# Patient Record
Sex: Female | Born: 1935 | Race: White | Hispanic: No | Marital: Married | Smoking: Never smoker
Health system: Southern US, Community
[De-identification: ages and names within clinical notes are randomized; demographics above are authoritative.]

## PROBLEM LIST (undated history)

## (undated) DIAGNOSIS — R413 Other amnesia: Secondary | ICD-10-CM

## (undated) DIAGNOSIS — E7800 Pure hypercholesterolemia, unspecified: Secondary | ICD-10-CM

## (undated) DIAGNOSIS — H3530 Unspecified macular degeneration: Secondary | ICD-10-CM

## (undated) DIAGNOSIS — Z853 Personal history of malignant neoplasm of breast: Secondary | ICD-10-CM

## (undated) DIAGNOSIS — L409 Psoriasis, unspecified: Secondary | ICD-10-CM

## (undated) DIAGNOSIS — M1990 Unspecified osteoarthritis, unspecified site: Secondary | ICD-10-CM

## (undated) DIAGNOSIS — C801 Malignant (primary) neoplasm, unspecified: Secondary | ICD-10-CM

## (undated) DIAGNOSIS — E349 Endocrine disorder, unspecified: Secondary | ICD-10-CM

## (undated) DIAGNOSIS — C4490 Unspecified malignant neoplasm of skin, unspecified: Secondary | ICD-10-CM

## (undated) DIAGNOSIS — N3090 Cystitis, unspecified without hematuria: Secondary | ICD-10-CM

## (undated) HISTORY — DX: Unspecified malignant neoplasm of skin, unspecified: C44.90

## (undated) HISTORY — DX: Psoriasis, unspecified: L40.9

## (undated) HISTORY — DX: Malignant (primary) neoplasm, unspecified: C80.1

## (undated) HISTORY — DX: Other amnesia: R41.3

## (undated) HISTORY — DX: Personal history of malignant neoplasm of breast: Z85.3

## (undated) HISTORY — DX: Cystitis, unspecified without hematuria: N30.90

## (undated) HISTORY — DX: Endocrine disorder, unspecified: E34.9

## (undated) HISTORY — DX: Pure hypercholesterolemia, unspecified: E78.00

## (undated) HISTORY — DX: Unspecified osteoarthritis, unspecified site: M19.90

## (undated) HISTORY — DX: Unspecified macular degeneration: H35.30

---
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
## 1999-02-28 DIAGNOSIS — C801 Malignant (primary) neoplasm, unspecified: Secondary | ICD-10-CM

## 1999-02-28 HISTORY — PX: BREAST LUMPECTOMY: SHX2

## 1999-02-28 HISTORY — DX: Malignant (primary) neoplasm, unspecified: C80.1

## 2000-02-28 DIAGNOSIS — Z853 Personal history of malignant neoplasm of breast: Secondary | ICD-10-CM

## 2000-02-28 HISTORY — DX: Personal history of malignant neoplasm of breast: Z85.3

## 2004-03-28 ENCOUNTER — Ambulatory Visit: Payer: Self-pay | Admitting: Family Medicine

## 2004-04-05 ENCOUNTER — Ambulatory Visit: Payer: Self-pay | Admitting: Internal Medicine

## 2004-04-05 ENCOUNTER — Ambulatory Visit: Payer: Self-pay | Admitting: Family Medicine

## 2004-04-27 ENCOUNTER — Ambulatory Visit: Payer: Self-pay | Admitting: Internal Medicine

## 2004-08-31 ENCOUNTER — Ambulatory Visit: Payer: Self-pay | Admitting: Chiropractic Medicine

## 2004-08-31 ENCOUNTER — Ambulatory Visit: Payer: Self-pay | Admitting: General Surgery

## 2004-10-12 ENCOUNTER — Ambulatory Visit: Payer: Self-pay | Admitting: Internal Medicine

## 2004-10-28 ENCOUNTER — Ambulatory Visit: Payer: Self-pay | Admitting: Internal Medicine

## 2005-04-03 ENCOUNTER — Ambulatory Visit: Payer: Self-pay | Admitting: General Surgery

## 2005-09-07 ENCOUNTER — Ambulatory Visit: Payer: Self-pay | Admitting: Gastroenterology

## 2005-10-19 ENCOUNTER — Ambulatory Visit: Payer: Self-pay | Admitting: Internal Medicine

## 2005-10-28 ENCOUNTER — Ambulatory Visit: Payer: Self-pay | Admitting: Internal Medicine

## 2006-04-06 ENCOUNTER — Ambulatory Visit: Payer: Self-pay | Admitting: General Surgery

## 2006-09-28 ENCOUNTER — Ambulatory Visit: Payer: Self-pay | Admitting: Internal Medicine

## 2006-10-19 ENCOUNTER — Ambulatory Visit: Payer: Self-pay | Admitting: Internal Medicine

## 2006-10-29 ENCOUNTER — Ambulatory Visit: Payer: Self-pay | Admitting: Internal Medicine

## 2007-02-19 IMAGING — CR DG LUMBAR SPINE 2-3V
1 series · 3 of 3 positions shown · non-contrast
Comparison: none

REASON FOR EXAM: neck and back pain
COMMENTS:

[Series 1: view not recorded · 0.17mm/px · 3 of 3 slices shown]
[im 1/3]
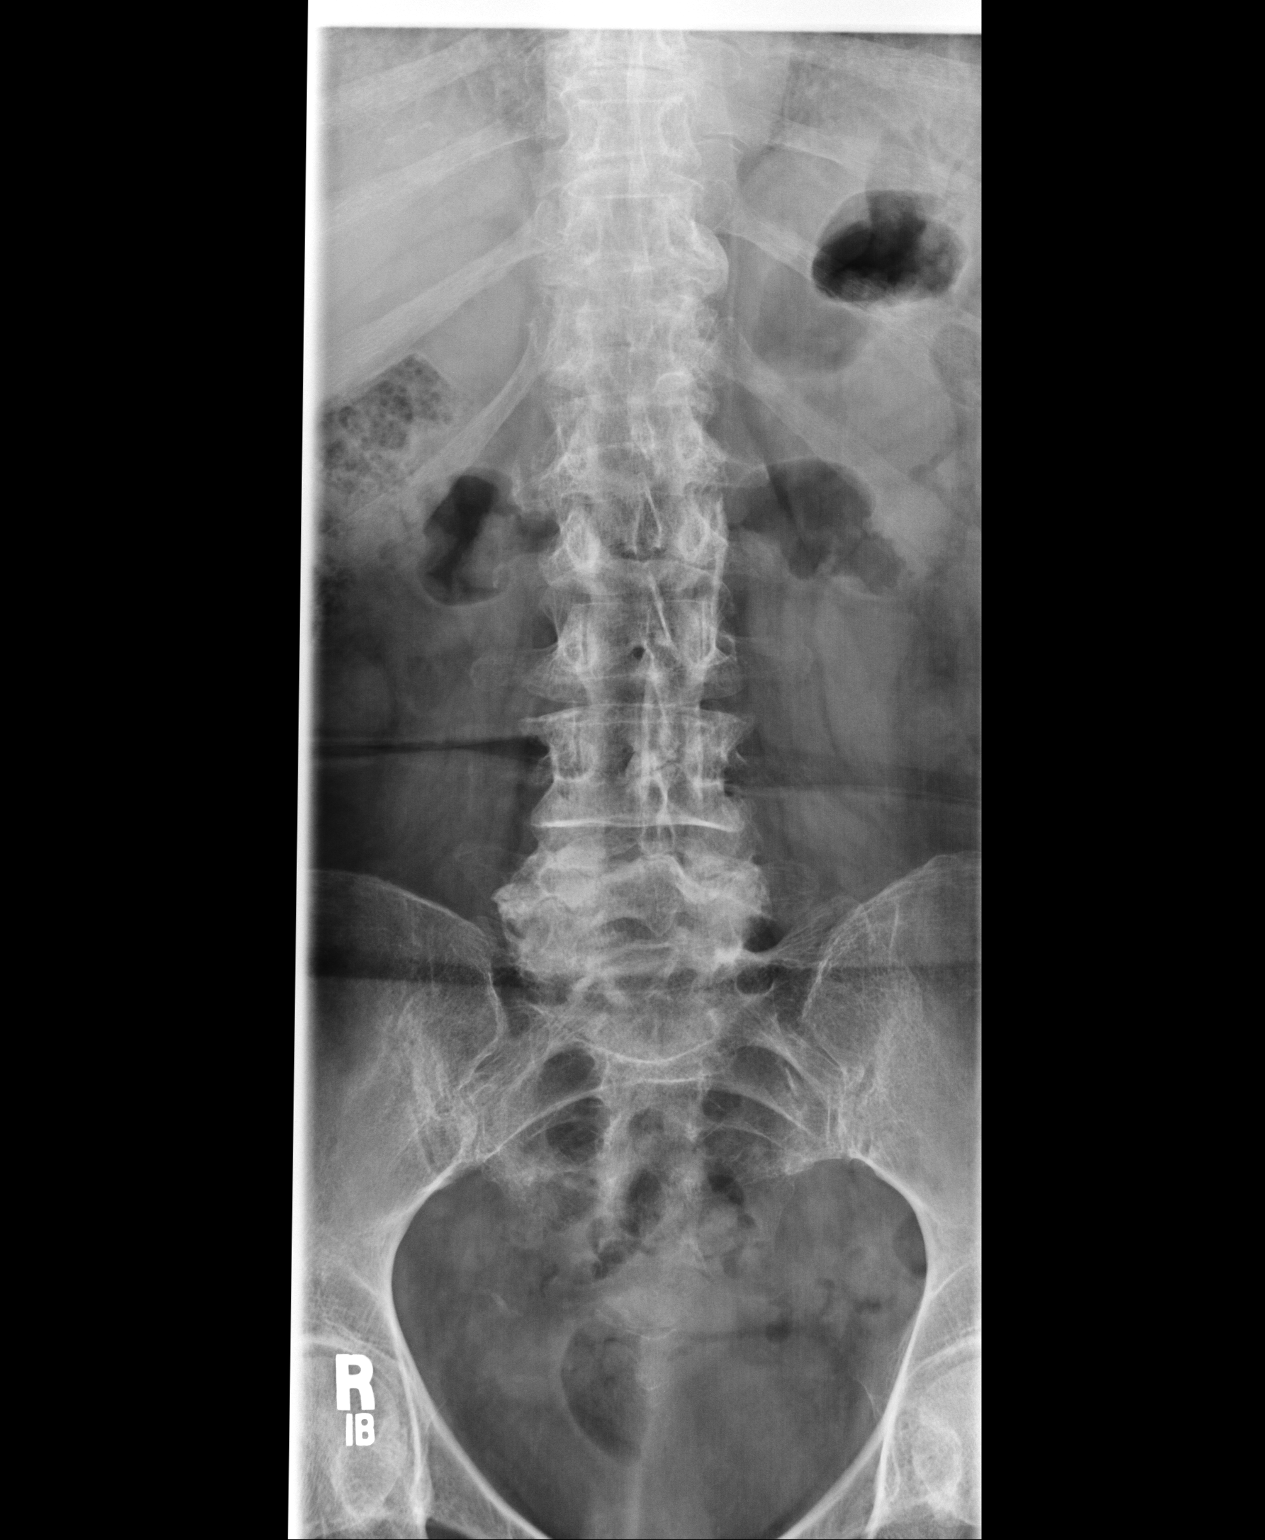
[im 2/3]
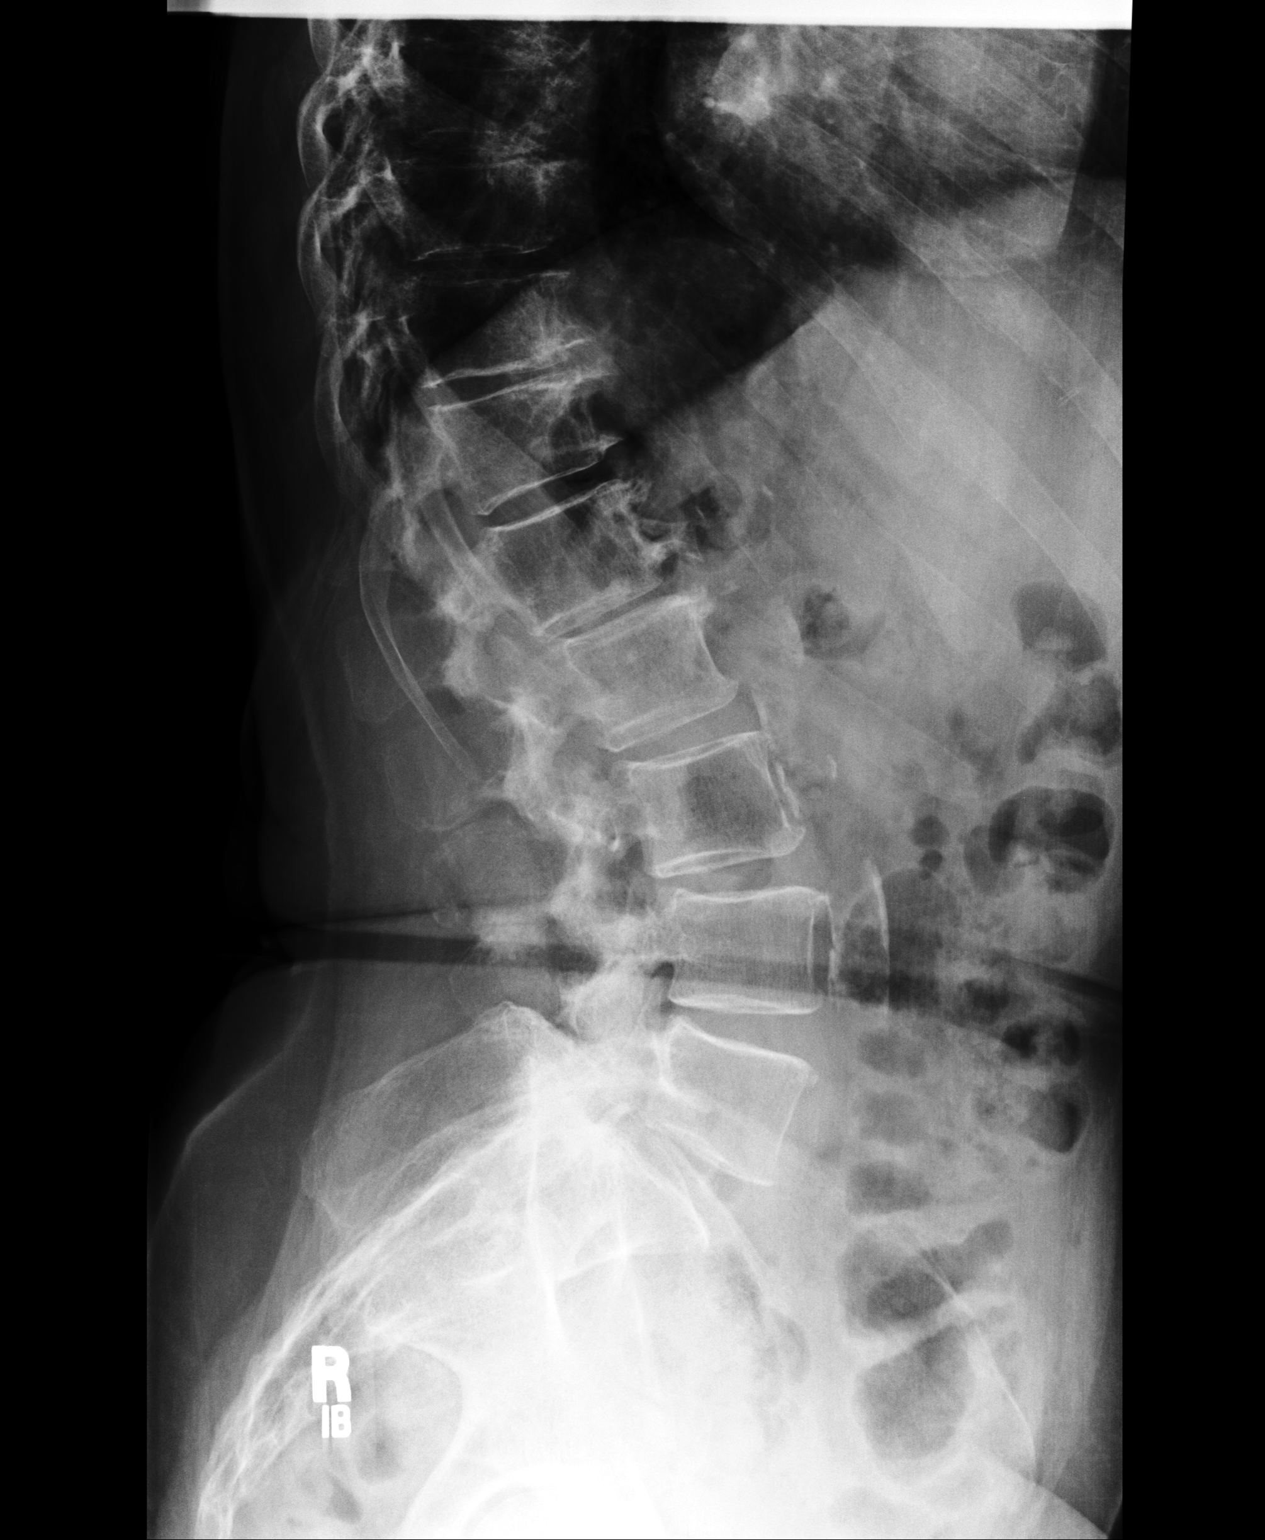
[im 3/3]
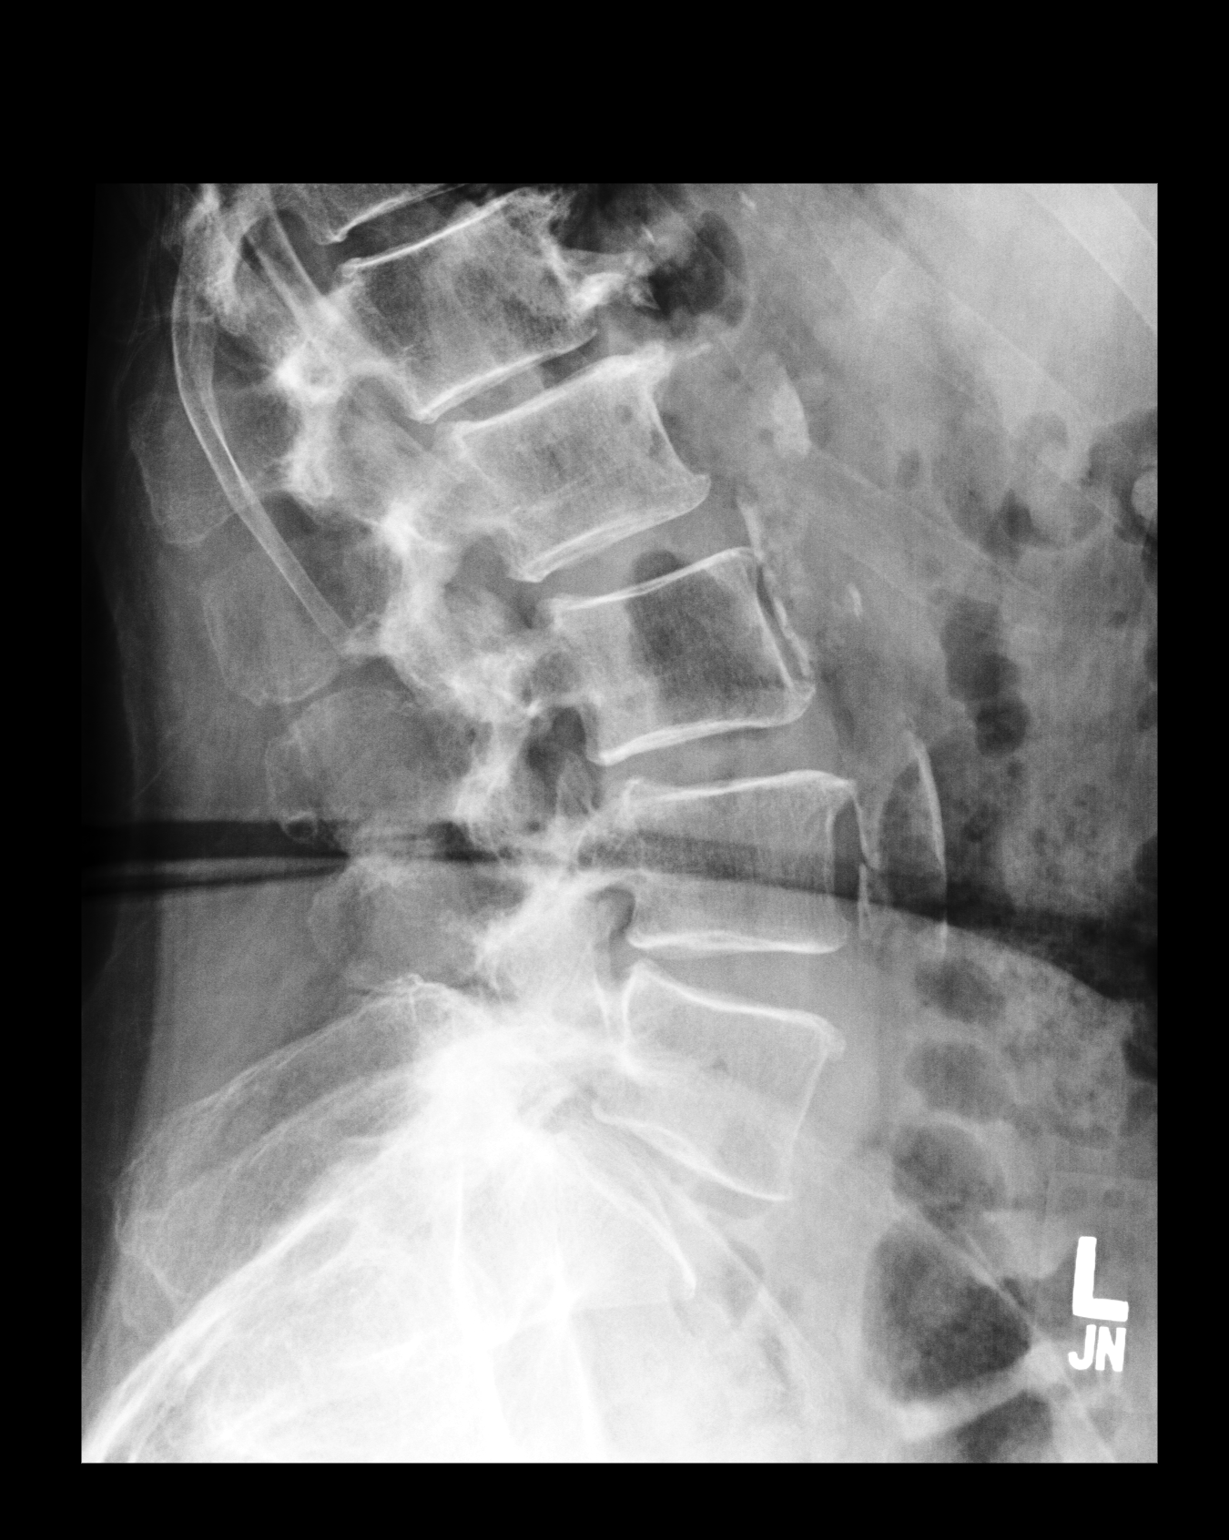

[3 of 3 positions shown; findings below may reference images not displayed]

PROCEDURE:     DXR - DXR LUMBAR SPINE AP AND LATERAL  - August 31, 2004  [DATE]

RESULT:        The lumbar vertebral bodies are preserved in height.  There
is end plate spurring noted especially at T11-12 and T12-L1.  The
intervertebral disc space heights appear reasonably well maintained.  I see
no pars defect.  There are facet joint degenerative changes in the lower
lumbar spine.
IMPRESSION: There are degenerative changes predominantly in the posterior elements of
the lower lumbar spine with end plate spurring noted in the upper lumbar
spine and lower thoracic spine.  I see no compression fracture nor
high-grade disc space narrowing.

## 2007-04-17 ENCOUNTER — Ambulatory Visit: Payer: Self-pay | Admitting: General Surgery

## 2007-10-17 ENCOUNTER — Ambulatory Visit: Payer: Self-pay | Admitting: Internal Medicine

## 2007-10-18 ENCOUNTER — Ambulatory Visit: Payer: Self-pay | Admitting: Internal Medicine

## 2007-10-29 ENCOUNTER — Ambulatory Visit: Payer: Self-pay | Admitting: Internal Medicine

## 2008-02-28 HISTORY — PX: COLONOSCOPY: SHX174

## 2008-04-06 ENCOUNTER — Ambulatory Visit: Payer: Self-pay | Admitting: General Surgery

## 2009-04-12 ENCOUNTER — Ambulatory Visit: Payer: Self-pay | Admitting: General Surgery

## 2010-05-05 ENCOUNTER — Ambulatory Visit: Payer: Self-pay | Admitting: General Surgery

## 2011-02-28 DIAGNOSIS — H3530 Unspecified macular degeneration: Secondary | ICD-10-CM

## 2011-02-28 HISTORY — DX: Unspecified macular degeneration: H35.30

## 2011-05-16 ENCOUNTER — Ambulatory Visit: Payer: Self-pay | Admitting: General Surgery

## 2012-04-19 ENCOUNTER — Encounter: Payer: Self-pay | Admitting: *Deleted

## 2012-04-19 DIAGNOSIS — E7800 Pure hypercholesterolemia, unspecified: Secondary | ICD-10-CM | POA: Insufficient documentation

## 2012-04-27 DIAGNOSIS — C4490 Unspecified malignant neoplasm of skin, unspecified: Secondary | ICD-10-CM

## 2012-04-27 HISTORY — PX: OTHER SURGICAL HISTORY: SHX169

## 2012-04-27 HISTORY — DX: Unspecified malignant neoplasm of skin, unspecified: C44.90

## 2012-05-16 ENCOUNTER — Ambulatory Visit: Payer: Self-pay | Admitting: General Surgery

## 2012-06-17 ENCOUNTER — Encounter: Payer: Self-pay | Admitting: *Deleted

## 2012-06-20 ENCOUNTER — Ambulatory Visit (INDEPENDENT_AMBULATORY_CARE_PROVIDER_SITE_OTHER): Payer: Medicare Other | Admitting: General Surgery

## 2012-06-20 ENCOUNTER — Other Ambulatory Visit: Payer: Self-pay | Admitting: *Deleted

## 2012-06-20 ENCOUNTER — Encounter: Payer: Self-pay | Admitting: General Surgery

## 2012-06-20 VITALS — BP 120/68 | HR 68 | Resp 16 | Ht <= 58 in | Wt 128.0 lb

## 2012-06-20 DIAGNOSIS — Z853 Personal history of malignant neoplasm of breast: Secondary | ICD-10-CM

## 2012-06-20 DIAGNOSIS — R1031 Right lower quadrant pain: Secondary | ICD-10-CM

## 2012-06-20 NOTE — Patient Instructions (Addendum)
Patient to return in 1 year with bilateral diagnostic mammogram. Patient has complained of mild abdominal pain in right lower quadrant. She is advised to call if pain worsens or develops nausea, vomiting, or bowel changes. Advised today that abdominal exam was remarkable.

## 2012-06-20 NOTE — Progress Notes (Signed)
Patient ID: Bonnie Buckley, female   DOB: 09-Sep-1920, 79 y.o.   MRN: [id number]  Chief Complaint  Patient presents with  . Other    mammo    HPI Bonnie Buckley is a 79 y.o. female here today for an follow up mammogram done 05/16/2012 with a birad category 2. The patient has a history of left breast cancer diagnosed in 2001. She was treated with lumpectomy and radiation therapy. No new breast problems. The patient denies regular self breast checks but does get regular mammograms.   HPI  Past Medical History  Diagnosis Date  . Macular degeneration 2013  . High cholesterol   . Cystitis 1950  . Endocrine disorder   . Psoriasis   . Personal history of malignant neoplasm of breast 2002    left lumpectomy  . Arthritis   . Cancer 2002  . Skin cancer 04/2012    left side of abdomen    Past Surgical History  Procedure Laterality Date  . Cesarean section  1963  . Breast lumpectomy Left 2002  . Colonoscopy  2010  . Skin cancer removal Left 04/2012    left side of abdomen    Family History  Problem Relation Age of Onset  . Breast cancer      Social History History  Substance Use Topics  . Smoking status: Never Smoker   . Smokeless tobacco: Not on file  . Alcohol Use: No    No Known Allergies  Current Outpatient Prescriptions  Medication Sig Dispense Refill  . Cholecalciferol (VITAMIN D) 2000 UNITS CAPS Take by mouth.      . clobetasol ointment (TEMOVATE) 0.05 % Apply 1 application topically as needed.      Marland Kitchen FLUOCINOLONE ACETONIDE BODY 0.01 % external oil Apply 1 application topically as needed.      Marland Kitchen levothyroxine (SYNTHROID, LEVOTHROID) 100 MCG tablet Take 100 mcg by mouth daily.      . pravastatin (PRAVACHOL) 40 MG tablet Take 40 mg by mouth daily.      . sertraline (ZOLOFT) 25 MG tablet Take 50 mg by mouth daily.       Marland Kitchen triamcinolone cream (KENALOG) 0.1 % Apply 1 application topically as needed.       No current facility-administered medications for this visit.    Review  of Systems Review of Systems  Constitutional: Negative.   Respiratory: Negative.   Cardiovascular: Negative.   Gastrointestinal: Positive for abdominal pain. Negative for nausea, vomiting, diarrhea, constipation, blood in stool, abdominal distention and rectal pain.       Intermittent pain rlq area, mild, no associated symptoms. Has had normal colonoscopy few yrs ago    Blood pressure 120/68, pulse 68, resp. rate 16, height 4\' 10"  (1.473 m), weight 128 lb (58.06 kg).  Physical Exam Physical Exam  Constitutional: She is oriented to person, place, and time. She appears well-developed and well-nourished.  Eyes: Conjunctivae are normal. No scleral icterus.  Neck: Trachea normal. No mass and no thyromegaly present.  Cardiovascular: Normal rate, regular rhythm and normal heart sounds.   No murmur heard. Pulses:      Dorsalis pedis pulses are 2+ on the right side, and 2+ on the left side.       Posterior tibial pulses are 0 on the right side, and 0 on the left side.  Pulmonary/Chest: Effort normal and breath sounds normal. Right breast exhibits no inverted nipple, no mass, no nipple discharge, no skin change and no tenderness. Left breast exhibits no inverted nipple,  no mass, no nipple discharge, no skin change and no tenderness.  Abdominal: Soft. Normal appearance and bowel sounds are normal. There is no hepatosplenomegaly. There is no tenderness. No hernia.  Lymphadenopathy:    She has no cervical adenopathy.    She has no axillary adenopathy.  Neurological: She is alert and oriented to person, place, and time.  Skin: Skin is warm and dry.    Data Reviewed Mammogram reviewed-stable.  Assessment    History of breast cancer, stable exam. Abd pain - appears to be mild, no findings on exam.     Plan    1 yr f/u. Also to cal if abd pain worsens         Catalyna Reilly G 06/21/2012, 6:18 AM

## 2012-06-20 NOTE — Progress Notes (Signed)
The patient has been asked to return to the office in one year for a bilateral diagnostic mammogram. 

## 2012-06-21 ENCOUNTER — Encounter: Payer: Self-pay | Admitting: General Surgery

## 2013-05-19 ENCOUNTER — Ambulatory Visit: Payer: Self-pay | Admitting: General Surgery

## 2013-05-20 ENCOUNTER — Encounter: Payer: Self-pay | Admitting: General Surgery

## 2013-05-28 ENCOUNTER — Ambulatory Visit: Payer: Self-pay | Admitting: Family Medicine

## 2013-06-05 ENCOUNTER — Ambulatory Visit: Payer: Medicare Other | Admitting: General Surgery

## 2013-06-11 ENCOUNTER — Encounter: Payer: Self-pay | Admitting: General Surgery

## 2013-06-11 ENCOUNTER — Ambulatory Visit (INDEPENDENT_AMBULATORY_CARE_PROVIDER_SITE_OTHER): Payer: Medicare Other | Admitting: General Surgery

## 2013-06-11 VITALS — BP 140/80 | HR 80 | Resp 16 | Ht 60.0 in | Wt 128.0 lb

## 2013-06-11 DIAGNOSIS — Z853 Personal history of malignant neoplasm of breast: Secondary | ICD-10-CM

## 2013-06-11 NOTE — Patient Instructions (Addendum)
Continue self breast exams. Call office for any new breast issues or concerns. Follow up in one year with an office visit.

## 2013-06-11 NOTE — Progress Notes (Signed)
Patient ID: Bonnie Buckley, female   DOB: 01/27/1921, 80 y.o.   MRN: [id number]  Chief Complaint  Patient presents with  . Follow-up    mammogram    HPI Bonnie Buckley is a 80 y.o. female.  who presents for her follow up breast evaluation. The most recent mammogram was done on 05-19-13. She has a history of left breast cancer 2001-treated with lumpectomy, SN, radiation and 19yrs of Arimedex. Patient does perform regular self breast checks and gets regular mammograms done. No new breast complaints.   HPI  Past Medical History  Diagnosis Date  . Macular degeneration 2013  . High cholesterol   . Cystitis 1950  . Endocrine disorder   . Psoriasis   . Personal history of malignant neoplasm of breast 2002    left lumpectomy  . Arthritis   . Cancer 2001    left breast   . Skin cancer 04/2012    left side of abdomen  . Memory change     Past Surgical History  Procedure Laterality Date  . Cesarean section  1963  . Colonoscopy  2010  . Skin cancer removal Left 04/2012    left side of abdomen  . Breast lumpectomy Left 2001    Family History  Problem Relation Age of Onset  . Breast cancer      Social History History  Substance Use Topics  . Smoking status: Never Smoker   . Smokeless tobacco: Never Used  . Alcohol Use: No    No Known Allergies  Current Outpatient Prescriptions  Medication Sig Dispense Refill  . levothyroxine (SYNTHROID, LEVOTHROID) 125 MCG tablet Take 125 mcg by mouth daily before breakfast.      . mirtazapine (REMERON) 15 MG tablet Take 15 mg by mouth at bedtime.       . Multiple Vitamins-Minerals (PRESERVISION AREDS 2 PO) Take by mouth daily.      Marland Kitchen triamcinolone cream (KENALOG) 0.1 % Apply 1 application topically as needed.       No current facility-administered medications for this visit.    Review of Systems Review of Systems  Constitutional: Negative.   Respiratory: Negative.   Cardiovascular: Negative.     Blood pressure 140/80,  pulse 80, resp. rate 16, height 5' (1.524 m), weight 128 lb (58.06 kg).  Physical Exam Physical Exam  Constitutional: She appears well-developed and well-nourished.  Eyes: Conjunctivae are normal.  Neck: Neck supple.  Cardiovascular: Normal rate, regular rhythm and normal heart sounds.   Pulmonary/Chest: Effort normal and breath sounds normal. Right breast exhibits no inverted nipple, no mass, no nipple discharge, no skin change and no tenderness. Left breast exhibits no inverted nipple, no mass, no nipple discharge, no skin change and no tenderness.  Lymphadenopathy:    She has no cervical adenopathy.    She has no axillary adenopathy.  Neurological: She is alert.  Skin: Skin is warm and dry.    Data Reviewed Mammogram reviewed and stable.  Assessment    Stable physical exam. The patient has been stable for 14 years post breast cancer. She is now 80 yrs old. Feel mammogram can be discontinued foir now. Pt advised to call for any breast symptoms. Will reexamine in 1 yr.     Plan    Follow up in one year with an office visit.       Bonnie Buckley 06/11/2013, 10:28 AM

## 2013-12-29 ENCOUNTER — Encounter: Payer: Self-pay | Admitting: General Surgery

## 2014-06-15 ENCOUNTER — Ambulatory Visit: Payer: Self-pay | Admitting: General Surgery

## 2014-11-09 ENCOUNTER — Ambulatory Visit: Payer: Self-pay

## 2016-10-28 DEATH — deceased
